# Patient Record
Sex: Female | Born: 1945 | Race: White | Marital: Married
Health system: Southern US, Community
[De-identification: ages and names within clinical notes are randomized; demographics above are authoritative.]

---

## 2014-04-15 ENCOUNTER — Ambulatory Visit (HOSPITAL_COMMUNITY)
Admission: RE | Admit: 2014-04-15 | Discharge: 2014-04-15 | Disposition: A | Discharge status: Home / Self Care | Payer: Commercial Managed Care - PPO | Source: Ambulatory Visit | Attending: Vascular Surgery | Admitting: Vascular Surgery

## 2014-04-15 ENCOUNTER — Other Ambulatory Visit (HOSPITAL_COMMUNITY): Payer: Self-pay | Admitting: Internal Medicine

## 2014-04-15 DIAGNOSIS — M79605 Pain in left leg: Secondary | ICD-10-CM

## 2014-04-15 DIAGNOSIS — M79609 Pain in unspecified limb: Secondary | ICD-10-CM | POA: Diagnosis not present

## 2014-05-19 ENCOUNTER — Encounter: Payer: Self-pay | Admitting: Internal Medicine

## 2014-06-12 ENCOUNTER — Encounter: Payer: Commercial Managed Care - PPO | Admitting: Internal Medicine

## 2014-07-27 ENCOUNTER — Telehealth (HOSPITAL_COMMUNITY): Payer: Self-pay | Admitting: *Deleted

## 2014-07-27 NOTE — Telephone Encounter (Signed)
I returned a call to Amy Singleton regarding a voice message she left for me.  The patient stated her insurance denied payment of her left lower extremity venous exam done in our department on 04/15/14.  The patient stated the denial was based on the billing code [id number].  I informed the patient I would review the information and contact the billing department and then update her as soon as I knew the final outcome.  Kathy Anaily Ashbaugh, RVT, RDCS

## 2015-05-13 DIAGNOSIS — H6983 Other specified disorders of Eustachian tube, bilateral: Secondary | ICD-10-CM | POA: Diagnosis not present

## 2015-05-13 DIAGNOSIS — H9312 Tinnitus, left ear: Secondary | ICD-10-CM | POA: Diagnosis not present

## 2015-05-13 DIAGNOSIS — H903 Sensorineural hearing loss, bilateral: Secondary | ICD-10-CM | POA: Diagnosis not present

## 2015-05-21 DIAGNOSIS — E039 Hypothyroidism, unspecified: Secondary | ICD-10-CM | POA: Diagnosis not present

## 2015-05-21 DIAGNOSIS — I10 Essential (primary) hypertension: Secondary | ICD-10-CM | POA: Diagnosis not present

## 2015-05-21 DIAGNOSIS — R7301 Impaired fasting glucose: Secondary | ICD-10-CM | POA: Diagnosis not present

## 2015-05-21 DIAGNOSIS — Z0000 Encounter for general adult medical examination without abnormal findings: Secondary | ICD-10-CM | POA: Diagnosis not present

## 2015-05-27 DIAGNOSIS — M179 Osteoarthritis of knee, unspecified: Secondary | ICD-10-CM | POA: Diagnosis not present

## 2015-05-27 DIAGNOSIS — E785 Hyperlipidemia, unspecified: Secondary | ICD-10-CM | POA: Diagnosis not present

## 2015-05-27 DIAGNOSIS — Z6839 Body mass index (BMI) 39.0-39.9, adult: Secondary | ICD-10-CM | POA: Diagnosis not present

## 2015-05-27 DIAGNOSIS — F17201 Nicotine dependence, unspecified, in remission: Secondary | ICD-10-CM | POA: Diagnosis not present

## 2015-05-27 DIAGNOSIS — R7301 Impaired fasting glucose: Secondary | ICD-10-CM | POA: Diagnosis not present

## 2015-05-27 DIAGNOSIS — Z23 Encounter for immunization: Secondary | ICD-10-CM | POA: Diagnosis not present

## 2015-05-27 DIAGNOSIS — I10 Essential (primary) hypertension: Secondary | ICD-10-CM | POA: Diagnosis not present

## 2015-05-27 DIAGNOSIS — Z0000 Encounter for general adult medical examination without abnormal findings: Secondary | ICD-10-CM | POA: Diagnosis not present

## 2015-05-27 DIAGNOSIS — M5416 Radiculopathy, lumbar region: Secondary | ICD-10-CM | POA: Diagnosis not present

## 2015-06-02 DIAGNOSIS — Z1212 Encounter for screening for malignant neoplasm of rectum: Secondary | ICD-10-CM | POA: Diagnosis not present

## 2015-07-09 ENCOUNTER — Encounter: Payer: Self-pay | Admitting: Internal Medicine

## 2015-08-24 DIAGNOSIS — Z79899 Other long term (current) drug therapy: Secondary | ICD-10-CM | POA: Diagnosis not present

## 2015-08-24 DIAGNOSIS — Z885 Allergy status to narcotic agent status: Secondary | ICD-10-CM | POA: Diagnosis not present

## 2015-08-24 DIAGNOSIS — Z6834 Body mass index (BMI) 34.0-34.9, adult: Secondary | ICD-10-CM | POA: Diagnosis not present

## 2015-08-24 DIAGNOSIS — R1011 Right upper quadrant pain: Secondary | ICD-10-CM | POA: Diagnosis not present

## 2015-08-24 DIAGNOSIS — R0789 Other chest pain: Secondary | ICD-10-CM | POA: Diagnosis not present

## 2015-08-24 DIAGNOSIS — K8000 Calculus of gallbladder with acute cholecystitis without obstruction: Secondary | ICD-10-CM | POA: Diagnosis present

## 2015-08-24 DIAGNOSIS — R112 Nausea with vomiting, unspecified: Secondary | ICD-10-CM | POA: Diagnosis not present

## 2015-08-24 DIAGNOSIS — K810 Acute cholecystitis: Secondary | ICD-10-CM | POA: Diagnosis not present

## 2015-08-24 DIAGNOSIS — E669 Obesity, unspecified: Secondary | ICD-10-CM | POA: Diagnosis present

## 2015-08-24 DIAGNOSIS — K812 Acute cholecystitis with chronic cholecystitis: Secondary | ICD-10-CM | POA: Diagnosis not present

## 2015-11-09 DIAGNOSIS — H269 Unspecified cataract: Secondary | ICD-10-CM | POA: Diagnosis not present

## 2015-11-09 DIAGNOSIS — H3561 Retinal hemorrhage, right eye: Secondary | ICD-10-CM | POA: Diagnosis not present

## 2016-05-26 DIAGNOSIS — I10 Essential (primary) hypertension: Secondary | ICD-10-CM | POA: Diagnosis not present

## 2016-05-26 DIAGNOSIS — E038 Other specified hypothyroidism: Secondary | ICD-10-CM | POA: Diagnosis not present

## 2016-05-26 DIAGNOSIS — R7301 Impaired fasting glucose: Secondary | ICD-10-CM | POA: Diagnosis not present

## 2016-05-26 DIAGNOSIS — E784 Other hyperlipidemia: Secondary | ICD-10-CM | POA: Diagnosis not present

## 2016-06-02 DIAGNOSIS — K219 Gastro-esophageal reflux disease without esophagitis: Secondary | ICD-10-CM | POA: Diagnosis not present

## 2016-06-02 DIAGNOSIS — Z23 Encounter for immunization: Secondary | ICD-10-CM | POA: Diagnosis not present

## 2016-06-02 DIAGNOSIS — E784 Other hyperlipidemia: Secondary | ICD-10-CM | POA: Diagnosis not present

## 2016-06-02 DIAGNOSIS — M179 Osteoarthritis of knee, unspecified: Secondary | ICD-10-CM | POA: Diagnosis not present

## 2016-06-02 DIAGNOSIS — R1013 Epigastric pain: Secondary | ICD-10-CM | POA: Diagnosis not present

## 2016-06-02 DIAGNOSIS — N183 Chronic kidney disease, stage 3 (moderate): Secondary | ICD-10-CM | POA: Diagnosis not present

## 2016-06-02 DIAGNOSIS — E038 Other specified hypothyroidism: Secondary | ICD-10-CM | POA: Diagnosis not present

## 2016-06-02 DIAGNOSIS — R634 Abnormal weight loss: Secondary | ICD-10-CM | POA: Diagnosis not present

## 2016-06-02 DIAGNOSIS — G4709 Other insomnia: Secondary | ICD-10-CM | POA: Diagnosis not present

## 2016-06-02 DIAGNOSIS — I10 Essential (primary) hypertension: Secondary | ICD-10-CM | POA: Diagnosis not present

## 2016-06-02 DIAGNOSIS — Z1212 Encounter for screening for malignant neoplasm of rectum: Secondary | ICD-10-CM | POA: Diagnosis not present

## 2016-06-02 DIAGNOSIS — R7301 Impaired fasting glucose: Secondary | ICD-10-CM | POA: Diagnosis not present

## 2016-06-05 ENCOUNTER — Other Ambulatory Visit: Payer: Self-pay | Admitting: Internal Medicine

## 2016-06-05 DIAGNOSIS — R634 Abnormal weight loss: Secondary | ICD-10-CM

## 2016-06-05 DIAGNOSIS — R1013 Epigastric pain: Secondary | ICD-10-CM

## 2016-06-08 ENCOUNTER — Ambulatory Visit
Admission: RE | Admit: 2016-06-08 | Discharge: 2016-06-08 | Disposition: A | Discharge status: Home / Self Care | Payer: Medicare Other | Source: Ambulatory Visit | Attending: Internal Medicine | Admitting: Internal Medicine

## 2016-06-08 ENCOUNTER — Other Ambulatory Visit: Payer: Self-pay | Admitting: Internal Medicine

## 2016-06-08 DIAGNOSIS — R634 Abnormal weight loss: Secondary | ICD-10-CM

## 2016-06-08 DIAGNOSIS — R1013 Epigastric pain: Secondary | ICD-10-CM

## 2016-06-12 DIAGNOSIS — Z1231 Encounter for screening mammogram for malignant neoplasm of breast: Secondary | ICD-10-CM | POA: Diagnosis not present

## 2016-06-30 ENCOUNTER — Encounter: Payer: Self-pay | Admitting: Internal Medicine

## 2017-05-15 IMAGING — CT CT ABD-PELV W/O CM
1 of 2 series · 14 of 32 positions shown, 18 images · non-contrast
Comparison: None.

CLINICAL DATA: 70-year-old with epigastric abdominal pain. 38 pound
weight loss. History cholecystectomy. Attention to the pancreas.

EXAM:
CT ABDOMEN AND PELVIS WITHOUT CONTRAST
TECHNIQUE: Multidetector CT imaging of the abdomen and pelvis was performed
following the standard protocol without IV contrast.

[Series 2: abd/pelvis w/(date) · axial · 0.81mm/px · z∈[+581,+1026]mm · 14 of 99 slices shown, 18 images]
[im 5/99  soft-tissue]
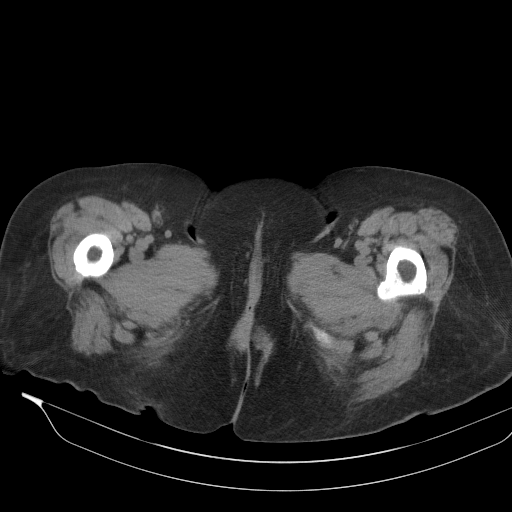
[im 5/99  bone]
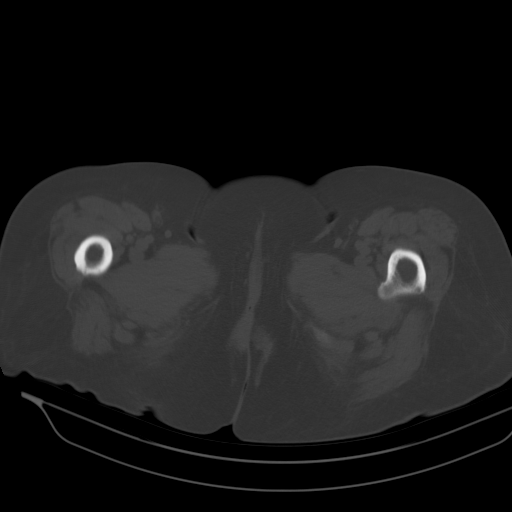
[im 13/99  soft-tissue]
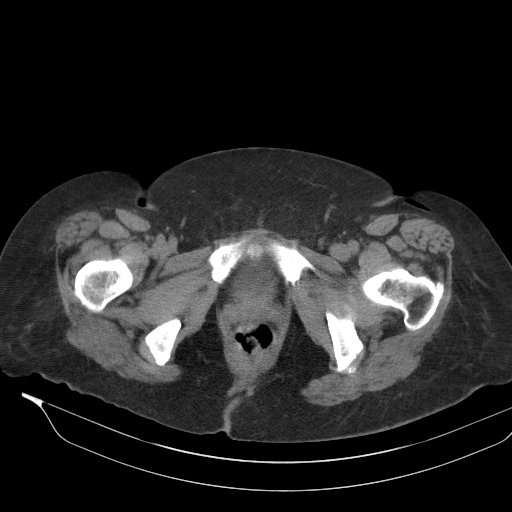
[im 21/99  soft-tissue]
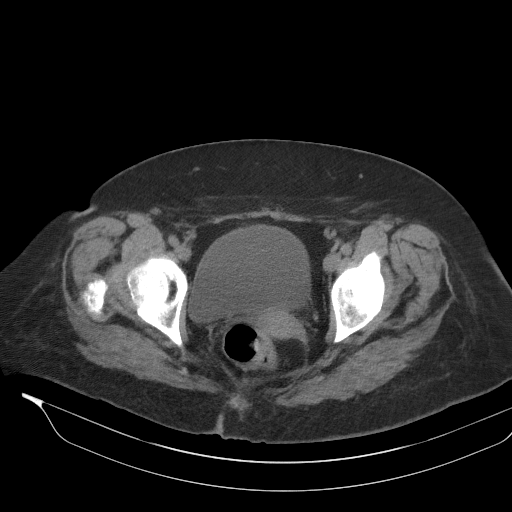
[im 29/99  soft-tissue]
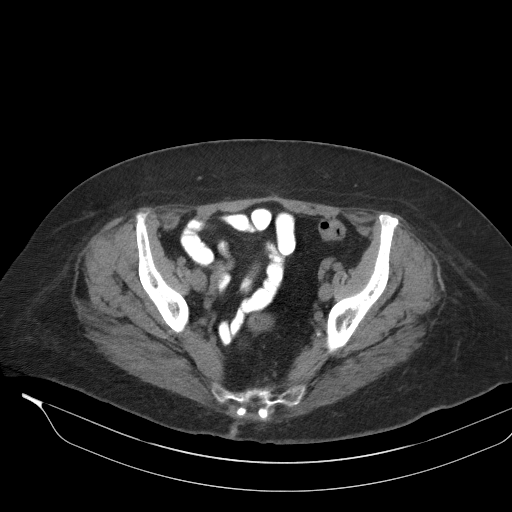
[im 37/99  soft-tissue]
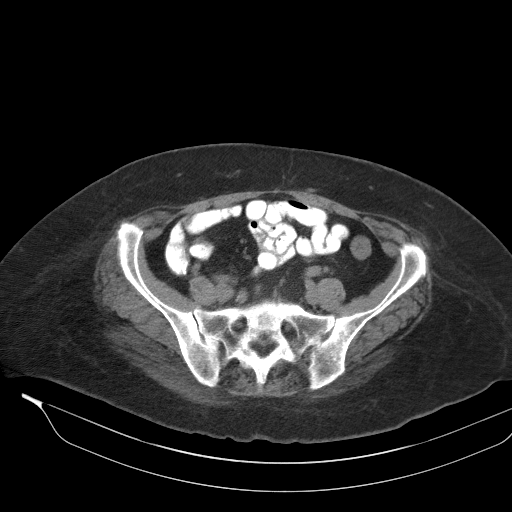
[im 45/99  soft-tissue]
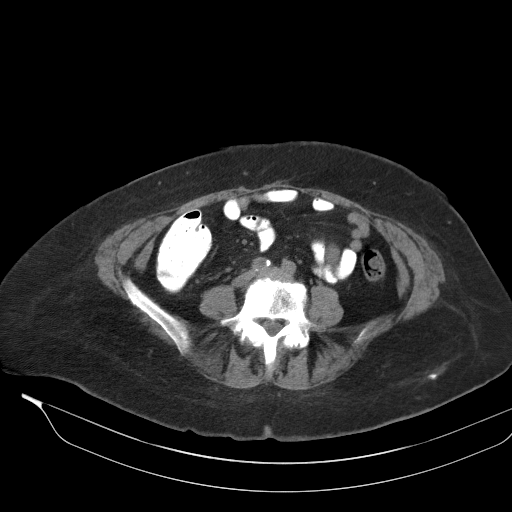
[im 54/99  soft-tissue]
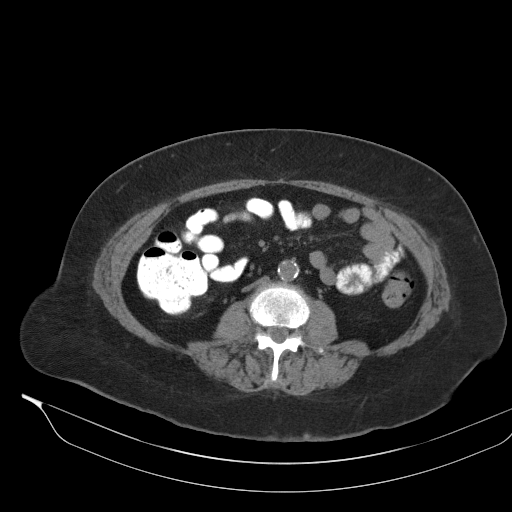
[im 62/99  soft-tissue]
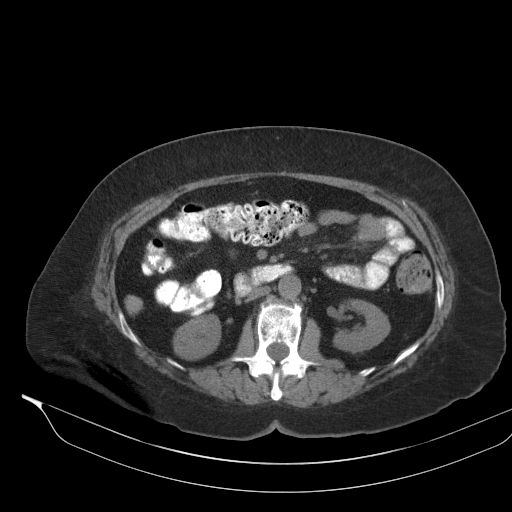
[im 70/99  soft-tissue]
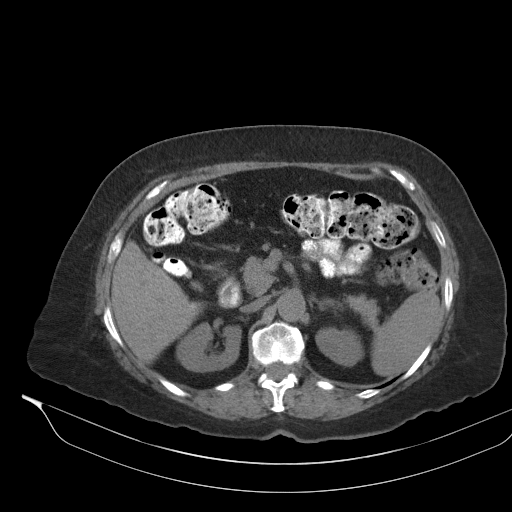
[im 70/99  bone]
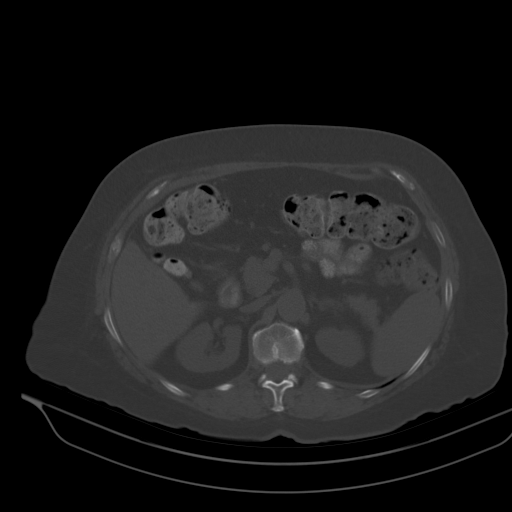
[im 78/99  soft-tissue]
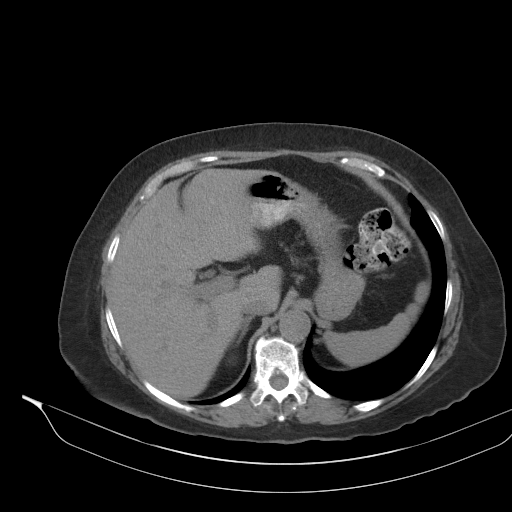
[im 82/99  lung]
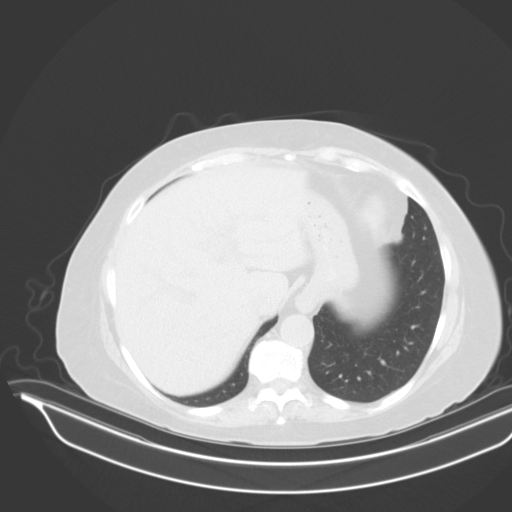
[im 86/99  soft-tissue]
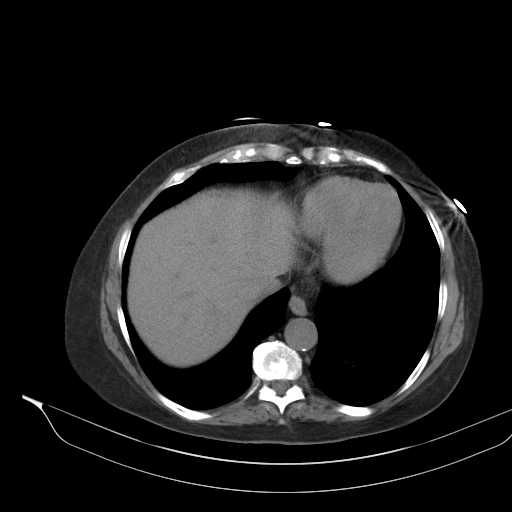
[im 86/99  lung]
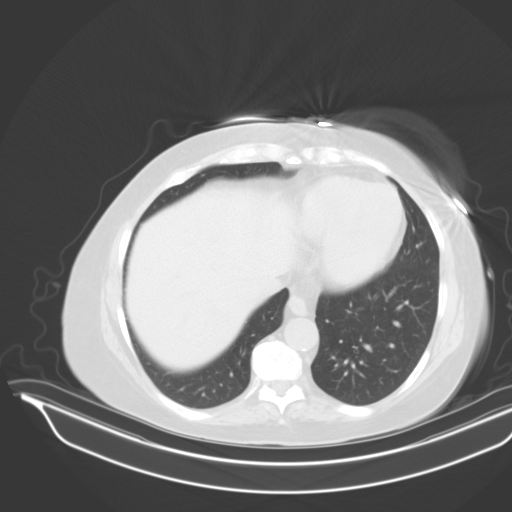
[im 90/99  lung]
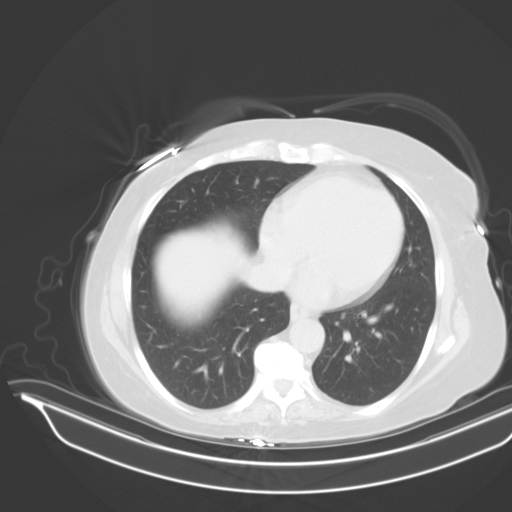
[im 94/99  soft-tissue]
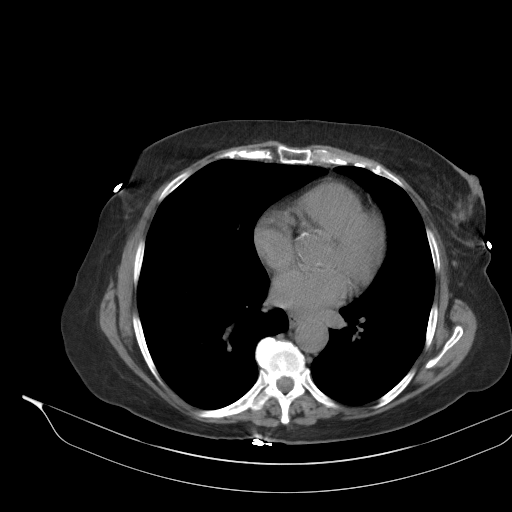
[im 94/99  lung]
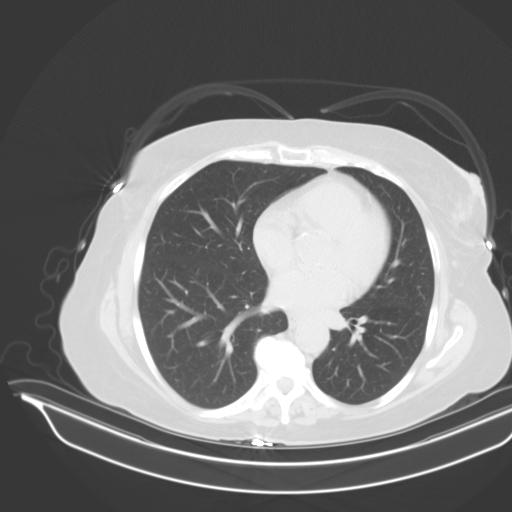

[14 of 32 positions shown; findings below may reference images not displayed]

FINDINGS: Lower chest: 2 mm nodule in the left lower lobe on the first image.
3 mm pleural-based nodule along the left major fissure on image 6.
No pleural effusions.

Hepatobiliary: The gallbladder has been removed. There are multiple
small calcifications along the inferior right hepatic lobe. These
calcifications could be dystrophic or represent gallstones from the
cholecystectomy. No inflammatory changes in this area. No gross
abnormality to the liver. No biliary dilatation.

Pancreas: Normal appearance of the pancreas without inflammation.
Please note that evaluation for a subtle pancreatic lesion would be
limited without intravenous contrast

Spleen: Normal appearance of spleen without enlargement. There are 2
prominent accessory spleens.

Adrenals/Urinary Tract: Normal adrenal glands. Small exophytic cyst
in the left kidney lower pole. Normal appearance of the right
kidney. No hydronephrosis. Urinary bladder is unremarkable.

Stomach/Bowel: Stomach and duodenum are unremarkable. There is oral
contrast in the small and large bowel. Appendix is partially filled
with contrast and no appendix inflammation. No evidence for bowel
obstruction or inflammation. There is minimal stranding in the
central abdominal mesentery which is probably within normal limits.

Vascular/Lymphatic: Atherosclerotic calcifications in the aorta
without aneurysm. No enlarged lymph nodes in the abdomen or pelvis.

Reproductive: Uterus and bilateral adnexa are unremarkable.

Other: Trace fluid in the pelvis. Tiny umbilical hernia containing
fat.

Musculoskeletal: 8 mm peripherally sclerotic structure in the right
iliac wing appears to have benign characteristics. Disc space
narrowing at L4-L5 and L5-S1.
IMPRESSION: No acute abnormality in the abdomen or pelvis. Trace fluid in the
pelvis is nonspecific.

Postoperative changes from cholecystectomy. Small calcifications
along the inferior right hepatic lobe could be postsurgical as
described. No inflammatory changes in this area.

Tiny lung nodules are indeterminate. No follow-up needed if patient
is low-risk (and has no known or suspected primary neoplasm).
Non-contrast chest CT can be considered in 12 months if patient is
high-risk. This recommendation follows the consensus statement:
Guidelines for Management of Incidental Pulmonary Nodules Detected

## 2017-06-05 DIAGNOSIS — Z0000 Encounter for general adult medical examination without abnormal findings: Secondary | ICD-10-CM | POA: Diagnosis not present

## 2017-06-05 DIAGNOSIS — E038 Other specified hypothyroidism: Secondary | ICD-10-CM | POA: Diagnosis not present

## 2017-06-05 DIAGNOSIS — E7849 Other hyperlipidemia: Secondary | ICD-10-CM | POA: Diagnosis not present

## 2017-06-05 DIAGNOSIS — I10 Essential (primary) hypertension: Secondary | ICD-10-CM | POA: Diagnosis not present

## 2017-06-05 DIAGNOSIS — R7301 Impaired fasting glucose: Secondary | ICD-10-CM | POA: Diagnosis not present

## 2017-06-12 DIAGNOSIS — M179 Osteoarthritis of knee, unspecified: Secondary | ICD-10-CM | POA: Diagnosis not present

## 2017-06-12 DIAGNOSIS — Z0000 Encounter for general adult medical examination without abnormal findings: Secondary | ICD-10-CM | POA: Diagnosis not present

## 2017-06-12 DIAGNOSIS — G4700 Insomnia, unspecified: Secondary | ICD-10-CM | POA: Diagnosis not present

## 2017-06-12 DIAGNOSIS — E7849 Other hyperlipidemia: Secondary | ICD-10-CM | POA: Diagnosis not present

## 2017-06-12 DIAGNOSIS — Z6832 Body mass index (BMI) 32.0-32.9, adult: Secondary | ICD-10-CM | POA: Diagnosis not present

## 2017-06-12 DIAGNOSIS — I10 Essential (primary) hypertension: Secondary | ICD-10-CM | POA: Diagnosis not present

## 2017-06-12 DIAGNOSIS — Z1389 Encounter for screening for other disorder: Secondary | ICD-10-CM | POA: Diagnosis not present

## 2017-06-12 DIAGNOSIS — E669 Obesity, unspecified: Secondary | ICD-10-CM | POA: Diagnosis not present

## 2017-06-12 DIAGNOSIS — N183 Chronic kidney disease, stage 3 (moderate): Secondary | ICD-10-CM | POA: Diagnosis not present

## 2017-06-12 DIAGNOSIS — Z23 Encounter for immunization: Secondary | ICD-10-CM | POA: Diagnosis not present

## 2017-06-12 DIAGNOSIS — R7301 Impaired fasting glucose: Secondary | ICD-10-CM | POA: Diagnosis not present

## 2017-06-12 DIAGNOSIS — E038 Other specified hypothyroidism: Secondary | ICD-10-CM | POA: Diagnosis not present

## 2017-06-18 DIAGNOSIS — Z1212 Encounter for screening for malignant neoplasm of rectum: Secondary | ICD-10-CM | POA: Diagnosis not present

## 2017-07-25 DIAGNOSIS — H903 Sensorineural hearing loss, bilateral: Secondary | ICD-10-CM | POA: Diagnosis not present

## 2017-07-25 DIAGNOSIS — H6121 Impacted cerumen, right ear: Secondary | ICD-10-CM | POA: Diagnosis not present

## 2017-07-31 DIAGNOSIS — H269 Unspecified cataract: Secondary | ICD-10-CM | POA: Diagnosis not present

## 2017-07-31 DIAGNOSIS — H3561 Retinal hemorrhage, right eye: Secondary | ICD-10-CM | POA: Diagnosis not present

## 2017-10-10 DIAGNOSIS — H527 Unspecified disorder of refraction: Secondary | ICD-10-CM | POA: Diagnosis not present

## 2017-10-10 DIAGNOSIS — H47323 Drusen of optic disc, bilateral: Secondary | ICD-10-CM | POA: Diagnosis not present

## 2017-10-10 DIAGNOSIS — H25813 Combined forms of age-related cataract, bilateral: Secondary | ICD-10-CM | POA: Diagnosis not present

## 2017-10-10 DIAGNOSIS — H52223 Regular astigmatism, bilateral: Secondary | ICD-10-CM | POA: Diagnosis not present

## 2017-10-22 DIAGNOSIS — H25813 Combined forms of age-related cataract, bilateral: Secondary | ICD-10-CM | POA: Diagnosis not present

## 2017-11-08 DIAGNOSIS — H47323 Drusen of optic disc, bilateral: Secondary | ICD-10-CM | POA: Diagnosis not present

## 2017-11-08 DIAGNOSIS — H25813 Combined forms of age-related cataract, bilateral: Secondary | ICD-10-CM | POA: Diagnosis not present

## 2017-11-08 DIAGNOSIS — H25811 Combined forms of age-related cataract, right eye: Secondary | ICD-10-CM | POA: Diagnosis not present

## 2017-11-08 DIAGNOSIS — Z79899 Other long term (current) drug therapy: Secondary | ICD-10-CM | POA: Diagnosis not present

## 2017-11-08 DIAGNOSIS — Z6832 Body mass index (BMI) 32.0-32.9, adult: Secondary | ICD-10-CM | POA: Diagnosis not present

## 2017-11-08 DIAGNOSIS — H52223 Regular astigmatism, bilateral: Secondary | ICD-10-CM | POA: Diagnosis not present

## 2017-11-08 DIAGNOSIS — H2511 Age-related nuclear cataract, right eye: Secondary | ICD-10-CM | POA: Diagnosis not present

## 2017-11-08 DIAGNOSIS — G608 Other hereditary and idiopathic neuropathies: Secondary | ICD-10-CM | POA: Diagnosis not present

## 2017-11-08 DIAGNOSIS — H527 Unspecified disorder of refraction: Secondary | ICD-10-CM | POA: Diagnosis not present

## 2017-11-08 DIAGNOSIS — I10 Essential (primary) hypertension: Secondary | ICD-10-CM | POA: Diagnosis not present

## 2017-11-08 DIAGNOSIS — K219 Gastro-esophageal reflux disease without esophagitis: Secondary | ICD-10-CM | POA: Diagnosis not present

## 2017-11-08 DIAGNOSIS — E669 Obesity, unspecified: Secondary | ICD-10-CM | POA: Diagnosis not present

## 2017-11-09 DIAGNOSIS — Z961 Presence of intraocular lens: Secondary | ICD-10-CM | POA: Diagnosis not present

## 2017-11-09 DIAGNOSIS — H52223 Regular astigmatism, bilateral: Secondary | ICD-10-CM | POA: Diagnosis not present

## 2017-11-09 DIAGNOSIS — H47323 Drusen of optic disc, bilateral: Secondary | ICD-10-CM | POA: Diagnosis not present

## 2017-11-09 DIAGNOSIS — H25812 Combined forms of age-related cataract, left eye: Secondary | ICD-10-CM | POA: Diagnosis not present

## 2017-11-09 DIAGNOSIS — H527 Unspecified disorder of refraction: Secondary | ICD-10-CM | POA: Diagnosis not present

## 2018-06-06 DIAGNOSIS — I10 Essential (primary) hypertension: Secondary | ICD-10-CM | POA: Diagnosis not present

## 2018-06-06 DIAGNOSIS — R82998 Other abnormal findings in urine: Secondary | ICD-10-CM | POA: Diagnosis not present

## 2018-06-06 DIAGNOSIS — E7849 Other hyperlipidemia: Secondary | ICD-10-CM | POA: Diagnosis not present

## 2018-06-06 DIAGNOSIS — E038 Other specified hypothyroidism: Secondary | ICD-10-CM | POA: Diagnosis not present

## 2018-06-06 DIAGNOSIS — R7301 Impaired fasting glucose: Secondary | ICD-10-CM | POA: Diagnosis not present

## 2018-06-13 DIAGNOSIS — E7849 Other hyperlipidemia: Secondary | ICD-10-CM | POA: Diagnosis not present

## 2018-06-13 DIAGNOSIS — R7301 Impaired fasting glucose: Secondary | ICD-10-CM | POA: Diagnosis not present

## 2018-06-13 DIAGNOSIS — M1711 Unilateral primary osteoarthritis, right knee: Secondary | ICD-10-CM | POA: Diagnosis not present

## 2018-06-13 DIAGNOSIS — E038 Other specified hypothyroidism: Secondary | ICD-10-CM | POA: Diagnosis not present

## 2018-06-13 DIAGNOSIS — I10 Essential (primary) hypertension: Secondary | ICD-10-CM | POA: Diagnosis not present

## 2018-06-13 DIAGNOSIS — E668 Other obesity: Secondary | ICD-10-CM | POA: Diagnosis not present

## 2018-06-13 DIAGNOSIS — N183 Chronic kidney disease, stage 3 (moderate): Secondary | ICD-10-CM | POA: Diagnosis not present

## 2018-06-13 DIAGNOSIS — Z1389 Encounter for screening for other disorder: Secondary | ICD-10-CM | POA: Diagnosis not present

## 2018-06-13 DIAGNOSIS — Z23 Encounter for immunization: Secondary | ICD-10-CM | POA: Diagnosis not present

## 2018-06-13 DIAGNOSIS — Z6834 Body mass index (BMI) 34.0-34.9, adult: Secondary | ICD-10-CM | POA: Diagnosis not present

## 2018-06-13 DIAGNOSIS — K219 Gastro-esophageal reflux disease without esophagitis: Secondary | ICD-10-CM | POA: Diagnosis not present

## 2018-06-13 DIAGNOSIS — Z0000 Encounter for general adult medical examination without abnormal findings: Secondary | ICD-10-CM | POA: Diagnosis not present

## 2018-06-13 DIAGNOSIS — M1712 Unilateral primary osteoarthritis, left knee: Secondary | ICD-10-CM | POA: Diagnosis not present

## 2018-07-02 DIAGNOSIS — Z1211 Encounter for screening for malignant neoplasm of colon: Secondary | ICD-10-CM | POA: Diagnosis not present

## 2018-08-08 DIAGNOSIS — Z1231 Encounter for screening mammogram for malignant neoplasm of breast: Secondary | ICD-10-CM | POA: Diagnosis not present

## 2018-08-15 DIAGNOSIS — H903 Sensorineural hearing loss, bilateral: Secondary | ICD-10-CM | POA: Diagnosis not present

## 2018-08-15 DIAGNOSIS — H9312 Tinnitus, left ear: Secondary | ICD-10-CM | POA: Diagnosis not present

## 2018-08-15 DIAGNOSIS — Z87891 Personal history of nicotine dependence: Secondary | ICD-10-CM | POA: Diagnosis not present

## 2018-08-15 DIAGNOSIS — H6123 Impacted cerumen, bilateral: Secondary | ICD-10-CM | POA: Diagnosis not present

## 2019-06-11 DIAGNOSIS — I10 Essential (primary) hypertension: Secondary | ICD-10-CM | POA: Diagnosis not present

## 2019-06-11 DIAGNOSIS — E038 Other specified hypothyroidism: Secondary | ICD-10-CM | POA: Diagnosis not present

## 2019-06-11 DIAGNOSIS — E7849 Other hyperlipidemia: Secondary | ICD-10-CM | POA: Diagnosis not present

## 2019-06-11 DIAGNOSIS — R7301 Impaired fasting glucose: Secondary | ICD-10-CM | POA: Diagnosis not present

## 2019-06-17 DIAGNOSIS — R82998 Other abnormal findings in urine: Secondary | ICD-10-CM | POA: Diagnosis not present

## 2019-06-17 DIAGNOSIS — I10 Essential (primary) hypertension: Secondary | ICD-10-CM | POA: Diagnosis not present

## 2019-06-18 DIAGNOSIS — E039 Hypothyroidism, unspecified: Secondary | ICD-10-CM | POA: Diagnosis not present

## 2019-06-18 DIAGNOSIS — R7301 Impaired fasting glucose: Secondary | ICD-10-CM | POA: Diagnosis not present

## 2019-06-18 DIAGNOSIS — E785 Hyperlipidemia, unspecified: Secondary | ICD-10-CM | POA: Diagnosis not present

## 2019-06-18 DIAGNOSIS — Z0000 Encounter for general adult medical examination without abnormal findings: Secondary | ICD-10-CM | POA: Diagnosis not present

## 2019-06-18 DIAGNOSIS — Z1331 Encounter for screening for depression: Secondary | ICD-10-CM | POA: Diagnosis not present

## 2019-06-18 DIAGNOSIS — M179 Osteoarthritis of knee, unspecified: Secondary | ICD-10-CM | POA: Diagnosis not present

## 2019-06-18 DIAGNOSIS — K219 Gastro-esophageal reflux disease without esophagitis: Secondary | ICD-10-CM | POA: Diagnosis not present

## 2019-06-18 DIAGNOSIS — G4700 Insomnia, unspecified: Secondary | ICD-10-CM | POA: Diagnosis not present

## 2019-06-18 DIAGNOSIS — I10 Essential (primary) hypertension: Secondary | ICD-10-CM | POA: Diagnosis not present

## 2019-06-18 DIAGNOSIS — E669 Obesity, unspecified: Secondary | ICD-10-CM | POA: Diagnosis not present

## 2019-06-18 DIAGNOSIS — N1832 Chronic kidney disease, stage 3b: Secondary | ICD-10-CM | POA: Diagnosis not present

## 2019-06-18 DIAGNOSIS — R195 Other fecal abnormalities: Secondary | ICD-10-CM | POA: Diagnosis not present

## 2019-09-16 ENCOUNTER — Ambulatory Visit: Payer: Medicare Other | Attending: Internal Medicine

## 2019-09-16 DIAGNOSIS — Z23 Encounter for immunization: Secondary | ICD-10-CM

## 2019-09-16 NOTE — Progress Notes (Signed)
   Covid-19 Vaccination Clinic  Name:  Amy Singleton    MRN: [id number] DOB: 11-Sep-1945  09/16/2019  Amy Singleton was observed post Covid-19 immunization for 15 minutes without incidence. She was provided with Vaccine Information Sheet and instruction to access the V-Safe system.   Amy Singleton was instructed to call 911 with any severe reactions post vaccine: Marland Kitchen Difficulty breathing  . Swelling of your face and throat  . A fast heartbeat  . A bad rash all over your body  . Dizziness and weakness    Immunizations Administered    Name Date Dose VIS Date Route   Pfizer COVID-19 Vaccine 09/16/2019  2:15 PM 0.3 mL 08/08/2019 Intramuscular   Manufacturer: [employer], [employer]   Lot: [id number]   NDC: [address]-2

## 2019-10-07 ENCOUNTER — Ambulatory Visit: Payer: PPO | Attending: Internal Medicine

## 2019-10-07 ENCOUNTER — Ambulatory Visit: Payer: Self-pay

## 2019-10-07 DIAGNOSIS — Z23 Encounter for immunization: Secondary | ICD-10-CM | POA: Insufficient documentation

## 2019-10-07 NOTE — Progress Notes (Signed)
   Covid-19 Vaccination Clinic  Name:  Amy Singleton    MRN: [id number] DOB: 04/30/1946  10/07/2019  [name] was observed post Covid-19 immunization for 15 minutes without incidence. She was provided with Vaccine Information Sheet and instruction to access the V-Safe system.   [name] was instructed to call 911 with any severe reactions post vaccine: Marland Kitchen Difficulty breathing  . Swelling of your face and throat  . A fast heartbeat  . A bad rash all over your body  . Dizziness and weakness    Immunizations Administered    Name Date Dose VIS Date Route   Pfizer COVID-19 Vaccine 10/07/2019  4:32 PM 0.3 mL 08/08/2019 Intramuscular   Manufacturer: [employer], [employer]   Lot: [id number]   NDC: [address]-3

## 2023-01-23 DIAGNOSIS — H47323 Drusen of optic disc, bilateral: Secondary | ICD-10-CM | POA: Diagnosis not present

## 2023-01-23 DIAGNOSIS — H2512 Age-related nuclear cataract, left eye: Secondary | ICD-10-CM | POA: Diagnosis not present

## 2023-01-23 DIAGNOSIS — Z961 Presence of intraocular lens: Secondary | ICD-10-CM | POA: Diagnosis not present

## 2023-01-23 DIAGNOSIS — H26491 Other secondary cataract, right eye: Secondary | ICD-10-CM | POA: Diagnosis not present

## 2023-01-23 DIAGNOSIS — H25042 Posterior subcapsular polar age-related cataract, left eye: Secondary | ICD-10-CM | POA: Diagnosis not present

## 2023-02-15 DIAGNOSIS — H526 Other disorders of refraction: Secondary | ICD-10-CM | POA: Diagnosis not present

## 2023-02-15 DIAGNOSIS — H52223 Regular astigmatism, bilateral: Secondary | ICD-10-CM | POA: Diagnosis not present

## 2023-02-15 DIAGNOSIS — H26491 Other secondary cataract, right eye: Secondary | ICD-10-CM | POA: Diagnosis not present

## 2023-02-15 DIAGNOSIS — H47323 Drusen of optic disc, bilateral: Secondary | ICD-10-CM | POA: Diagnosis not present

## 2023-02-15 DIAGNOSIS — H25812 Combined forms of age-related cataract, left eye: Secondary | ICD-10-CM | POA: Diagnosis not present

## 2023-02-15 DIAGNOSIS — Z961 Presence of intraocular lens: Secondary | ICD-10-CM | POA: Diagnosis not present

## 2023-02-20 DIAGNOSIS — Z7982 Long term (current) use of aspirin: Secondary | ICD-10-CM | POA: Diagnosis not present

## 2023-02-20 DIAGNOSIS — I6523 Occlusion and stenosis of bilateral carotid arteries: Secondary | ICD-10-CM | POA: Diagnosis not present

## 2023-02-20 DIAGNOSIS — Z87891 Personal history of nicotine dependence: Secondary | ICD-10-CM | POA: Diagnosis not present

## 2023-05-31 DIAGNOSIS — E039 Hypothyroidism, unspecified: Secondary | ICD-10-CM | POA: Diagnosis not present

## 2023-05-31 DIAGNOSIS — Z0189 Encounter for other specified special examinations: Secondary | ICD-10-CM | POA: Diagnosis not present

## 2023-05-31 DIAGNOSIS — Z1389 Encounter for screening for other disorder: Secondary | ICD-10-CM | POA: Diagnosis not present

## 2023-05-31 DIAGNOSIS — E785 Hyperlipidemia, unspecified: Secondary | ICD-10-CM | POA: Diagnosis not present

## 2023-05-31 DIAGNOSIS — Z0000 Encounter for general adult medical examination without abnormal findings: Secondary | ICD-10-CM | POA: Diagnosis not present

## 2023-05-31 DIAGNOSIS — I10 Essential (primary) hypertension: Secondary | ICD-10-CM | POA: Diagnosis not present

## 2023-05-31 DIAGNOSIS — R7301 Impaired fasting glucose: Secondary | ICD-10-CM | POA: Diagnosis not present

## 2023-06-07 DIAGNOSIS — Z1339 Encounter for screening examination for other mental health and behavioral disorders: Secondary | ICD-10-CM | POA: Diagnosis not present

## 2023-06-07 DIAGNOSIS — Z23 Encounter for immunization: Secondary | ICD-10-CM | POA: Diagnosis not present

## 2023-06-07 DIAGNOSIS — R82998 Other abnormal findings in urine: Secondary | ICD-10-CM | POA: Diagnosis not present

## 2023-06-07 DIAGNOSIS — Z1331 Encounter for screening for depression: Secondary | ICD-10-CM | POA: Diagnosis not present

## 2023-06-07 DIAGNOSIS — R7301 Impaired fasting glucose: Secondary | ICD-10-CM | POA: Diagnosis not present

## 2023-06-07 DIAGNOSIS — E785 Hyperlipidemia, unspecified: Secondary | ICD-10-CM | POA: Diagnosis not present

## 2023-06-07 DIAGNOSIS — I129 Hypertensive chronic kidney disease with stage 1 through stage 4 chronic kidney disease, or unspecified chronic kidney disease: Secondary | ICD-10-CM | POA: Diagnosis not present

## 2023-06-07 DIAGNOSIS — Z0000 Encounter for general adult medical examination without abnormal findings: Secondary | ICD-10-CM | POA: Diagnosis not present

## 2023-06-07 DIAGNOSIS — R748 Abnormal levels of other serum enzymes: Secondary | ICD-10-CM | POA: Diagnosis not present

## 2023-06-07 DIAGNOSIS — E039 Hypothyroidism, unspecified: Secondary | ICD-10-CM | POA: Diagnosis not present

## 2023-06-07 DIAGNOSIS — E669 Obesity, unspecified: Secondary | ICD-10-CM | POA: Diagnosis not present

## 2023-06-07 DIAGNOSIS — N1831 Chronic kidney disease, stage 3a: Secondary | ICD-10-CM | POA: Diagnosis not present

## 2023-06-07 DIAGNOSIS — I6523 Occlusion and stenosis of bilateral carotid arteries: Secondary | ICD-10-CM | POA: Diagnosis not present

## 2023-08-09 DIAGNOSIS — E785 Hyperlipidemia, unspecified: Secondary | ICD-10-CM | POA: Diagnosis not present

## 2023-08-09 DIAGNOSIS — I10 Essential (primary) hypertension: Secondary | ICD-10-CM | POA: Diagnosis not present

## 2023-08-09 DIAGNOSIS — E039 Hypothyroidism, unspecified: Secondary | ICD-10-CM | POA: Diagnosis not present

## 2024-07-01 DIAGNOSIS — E785 Hyperlipidemia, unspecified: Secondary | ICD-10-CM | POA: Diagnosis not present

## 2024-07-03 DIAGNOSIS — I129 Hypertensive chronic kidney disease with stage 1 through stage 4 chronic kidney disease, or unspecified chronic kidney disease: Secondary | ICD-10-CM | POA: Diagnosis not present

## 2024-07-03 DIAGNOSIS — R7301 Impaired fasting glucose: Secondary | ICD-10-CM | POA: Diagnosis not present

## 2024-07-03 DIAGNOSIS — E785 Hyperlipidemia, unspecified: Secondary | ICD-10-CM | POA: Diagnosis not present

## 2024-07-03 DIAGNOSIS — E039 Hypothyroidism, unspecified: Secondary | ICD-10-CM | POA: Diagnosis not present

## 2024-07-08 DIAGNOSIS — I6523 Occlusion and stenosis of bilateral carotid arteries: Secondary | ICD-10-CM | POA: Diagnosis not present

## 2024-07-08 DIAGNOSIS — Z1389 Encounter for screening for other disorder: Secondary | ICD-10-CM | POA: Diagnosis not present

## 2024-07-08 DIAGNOSIS — D649 Anemia, unspecified: Secondary | ICD-10-CM | POA: Diagnosis not present

## 2024-07-08 DIAGNOSIS — R748 Abnormal levels of other serum enzymes: Secondary | ICD-10-CM | POA: Diagnosis not present

## 2024-07-08 DIAGNOSIS — E039 Hypothyroidism, unspecified: Secondary | ICD-10-CM | POA: Diagnosis not present

## 2024-07-08 DIAGNOSIS — N1831 Chronic kidney disease, stage 3a: Secondary | ICD-10-CM | POA: Diagnosis not present

## 2024-07-08 DIAGNOSIS — M179 Osteoarthritis of knee, unspecified: Secondary | ICD-10-CM | POA: Diagnosis not present

## 2024-07-08 DIAGNOSIS — Z1339 Encounter for screening examination for other mental health and behavioral disorders: Secondary | ICD-10-CM | POA: Diagnosis not present

## 2024-07-08 DIAGNOSIS — R82998 Other abnormal findings in urine: Secondary | ICD-10-CM | POA: Diagnosis not present

## 2024-07-08 DIAGNOSIS — E785 Hyperlipidemia, unspecified: Secondary | ICD-10-CM | POA: Diagnosis not present

## 2024-07-08 DIAGNOSIS — Z23 Encounter for immunization: Secondary | ICD-10-CM | POA: Diagnosis not present

## 2024-07-08 DIAGNOSIS — G4700 Insomnia, unspecified: Secondary | ICD-10-CM | POA: Diagnosis not present

## 2024-07-08 DIAGNOSIS — E669 Obesity, unspecified: Secondary | ICD-10-CM | POA: Diagnosis not present

## 2024-07-08 DIAGNOSIS — R7301 Impaired fasting glucose: Secondary | ICD-10-CM | POA: Diagnosis not present

## 2024-07-08 DIAGNOSIS — Z1331 Encounter for screening for depression: Secondary | ICD-10-CM | POA: Diagnosis not present

## 2024-07-08 DIAGNOSIS — Z0000 Encounter for general adult medical examination without abnormal findings: Secondary | ICD-10-CM | POA: Diagnosis not present

## 2024-07-08 DIAGNOSIS — I129 Hypertensive chronic kidney disease with stage 1 through stage 4 chronic kidney disease, or unspecified chronic kidney disease: Secondary | ICD-10-CM | POA: Diagnosis not present
